# Patient Record
Sex: Female | Born: 1965 | Race: Black or African American | Hispanic: No | Marital: Single
Health system: Southern US, Community
[De-identification: ages and names within clinical notes are randomized; demographics above are authoritative.]

## PROBLEM LIST (undated history)

## (undated) DIAGNOSIS — E119 Type 2 diabetes mellitus without complications: Secondary | ICD-10-CM

---

## 2000-01-18 ENCOUNTER — Emergency Department (HOSPITAL_COMMUNITY): Admission: EM | Admit: 2000-01-18 | Discharge: 2000-01-18 | Payer: Self-pay | Admitting: Emergency Medicine

## 2005-10-06 ENCOUNTER — Emergency Department (HOSPITAL_COMMUNITY): Admission: EM | Admit: 2005-10-06 | Discharge: 2005-10-06 | Payer: Self-pay | Admitting: Emergency Medicine

## 2005-10-15 ENCOUNTER — Emergency Department (HOSPITAL_COMMUNITY): Admission: AD | Admit: 2005-10-15 | Discharge: 2005-10-15 | Payer: Self-pay | Admitting: Emergency Medicine

## 2006-11-03 ENCOUNTER — Emergency Department (HOSPITAL_COMMUNITY): Admission: EM | Admit: 2006-11-03 | Discharge: 2006-11-03 | Payer: Self-pay | Admitting: Emergency Medicine

## 2007-08-29 ENCOUNTER — Emergency Department (HOSPITAL_COMMUNITY): Admission: EM | Admit: 2007-08-29 | Discharge: 2007-08-29 | Payer: Self-pay | Admitting: Emergency Medicine

## 2007-09-30 ENCOUNTER — Inpatient Hospital Stay (HOSPITAL_COMMUNITY): Admission: EM | Admit: 2007-09-30 | Discharge: 2007-10-02 | Payer: Self-pay | Admitting: Emergency Medicine

## 2009-07-15 IMAGING — US US ABDOMEN COMPLETE
1 series · 14 of 25 positions shown · non-contrast
Comparison: None.

CLINICAL DATA: Nausea and vomiting.  Elevated bilirubin. 
 ABDOMEN ULTRASOUND:
TECHNIQUE: Complete abdominal ultrasound examination was performed including evaluation of the liver, gallbladder, bile ducts, pancreas, kidneys, spleen, IVC, and abdominal aorta.

[Series 1: unknown · 0.28mm/px · 14 of 54 slices shown]
[im 1/54]
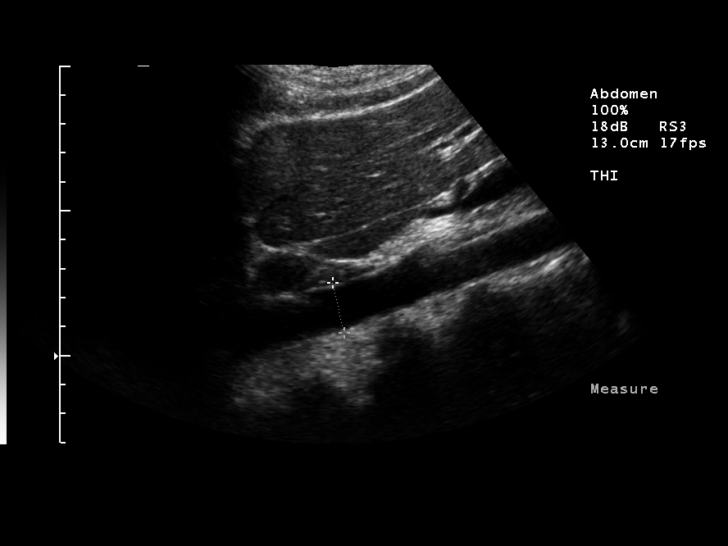
[im 5/54]
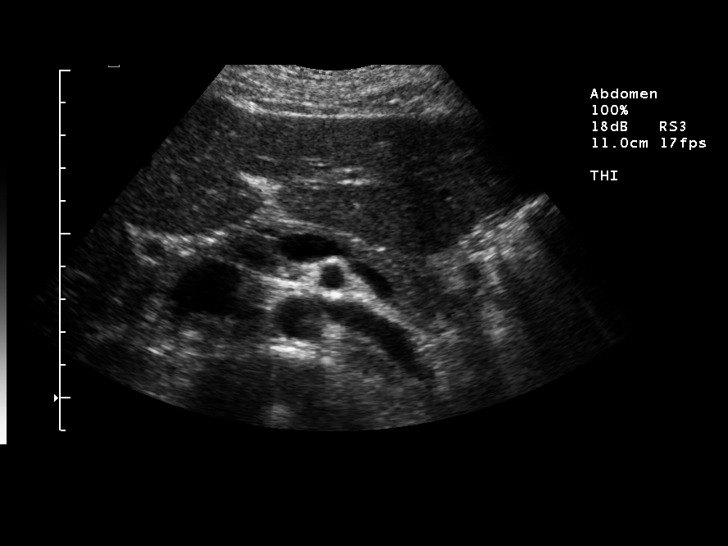
[im 9/54]
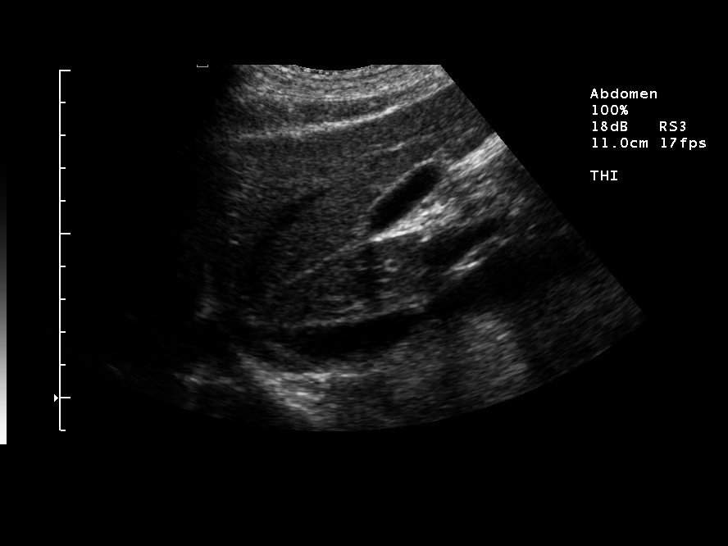
[im 14/54]
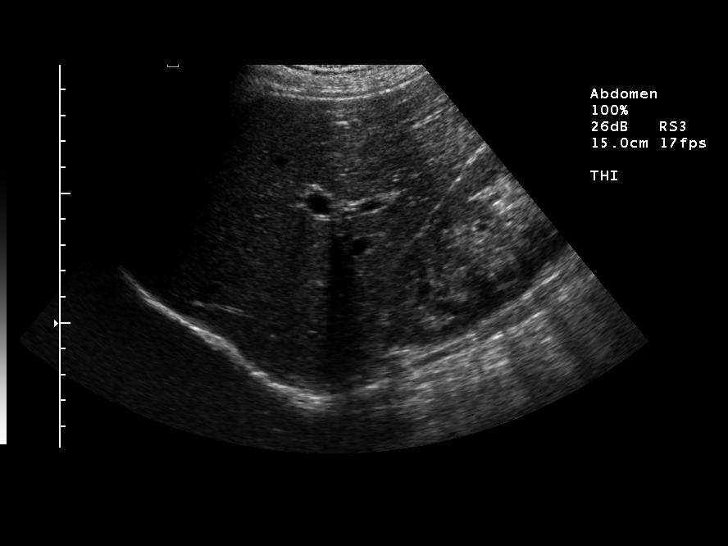
[im 18/54]
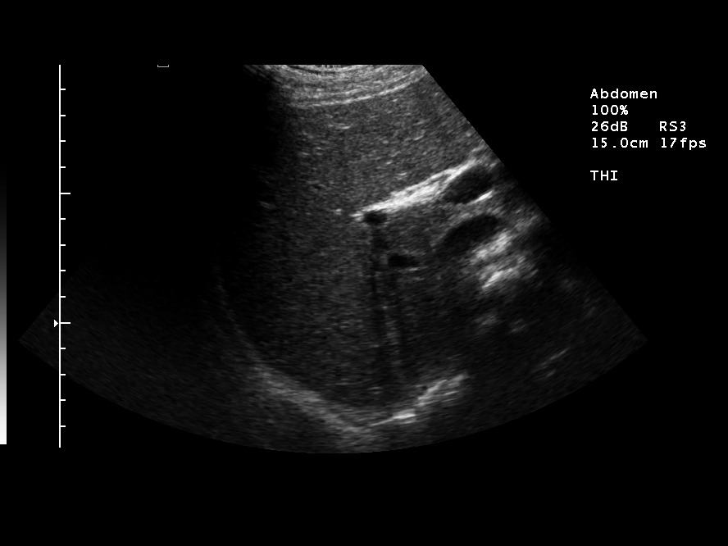
[im 20/54]
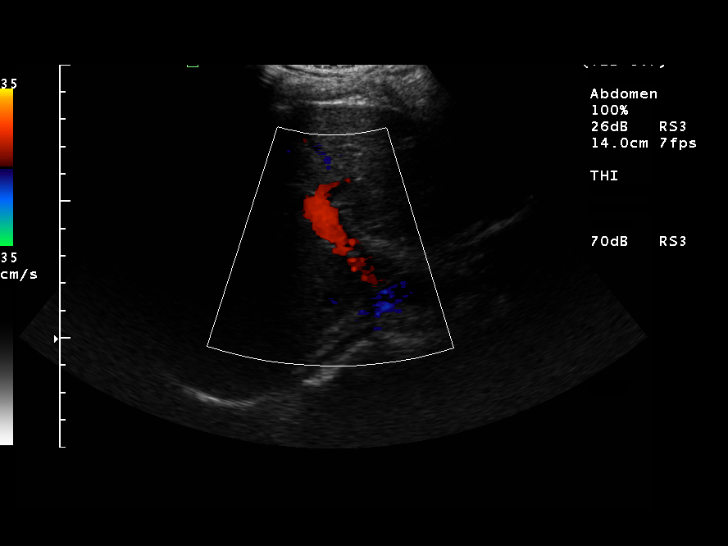
[im 25/54]
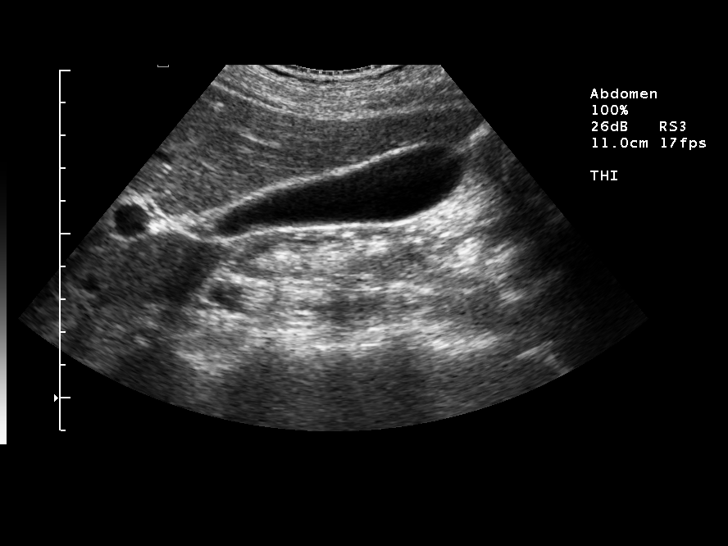
[im 29/54]
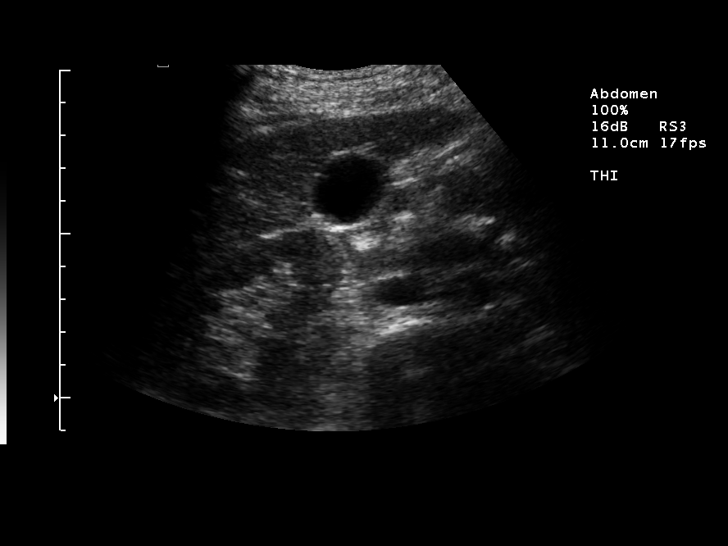
[im 34/54]
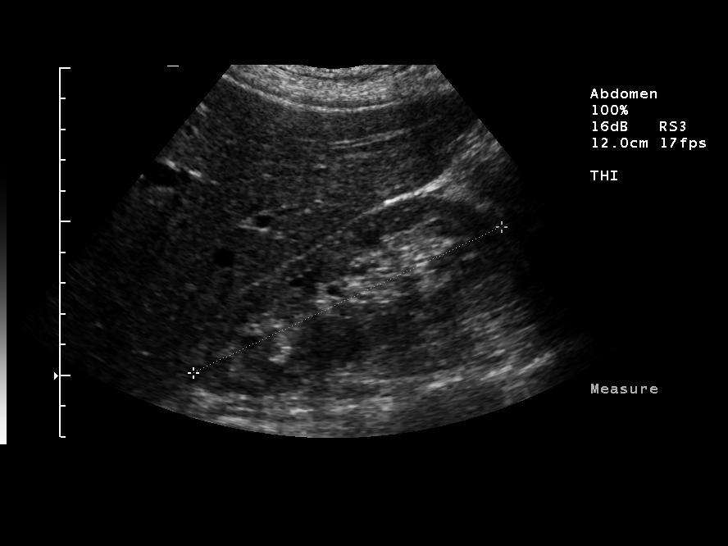
[im 36/54]
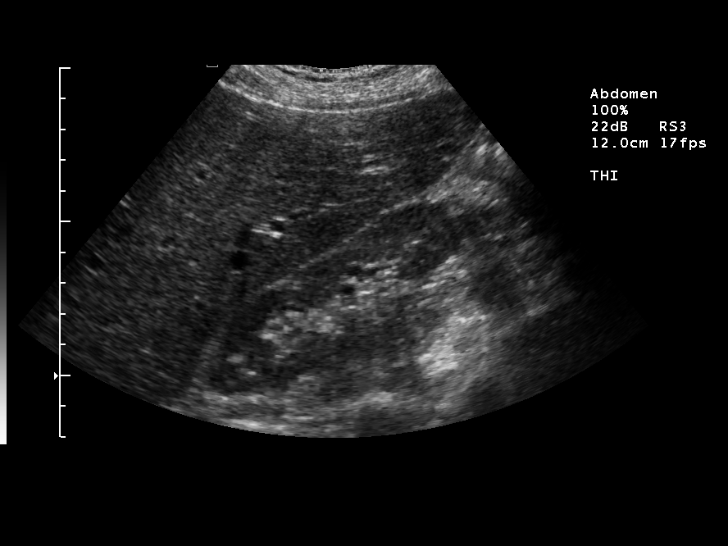
[im 40/54]
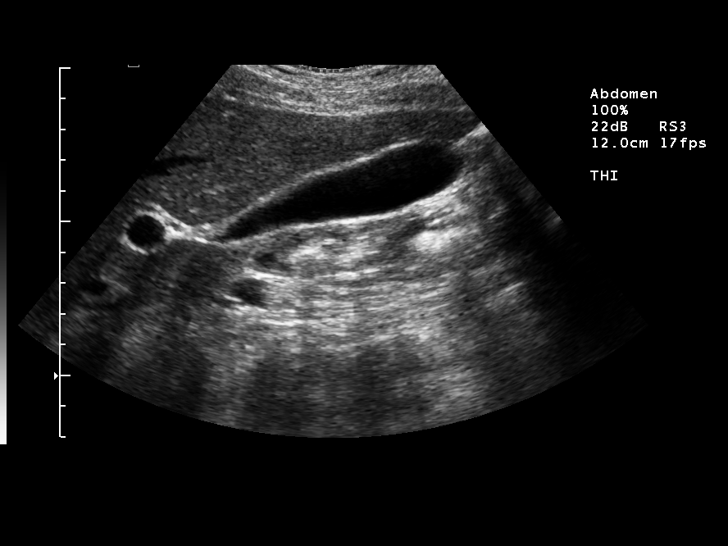
[im 45/54]
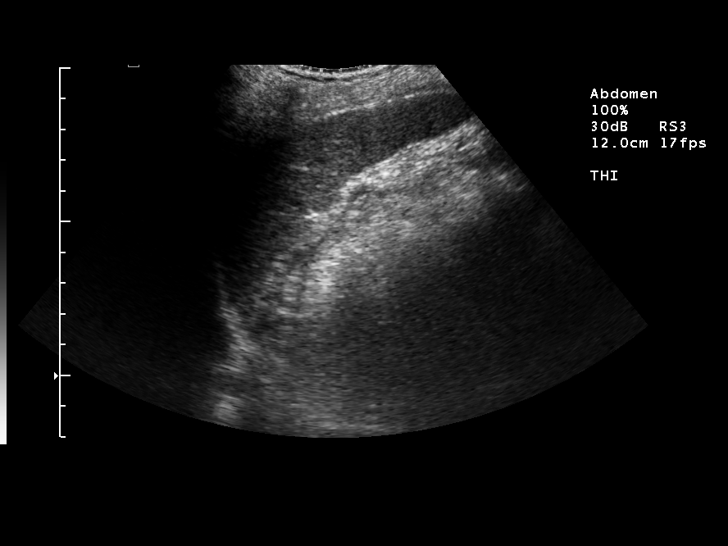
[im 49/54]
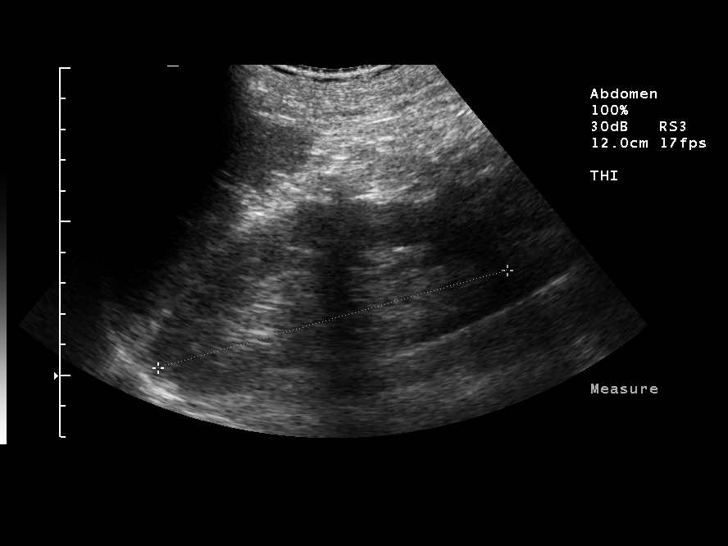
[im 54/54]
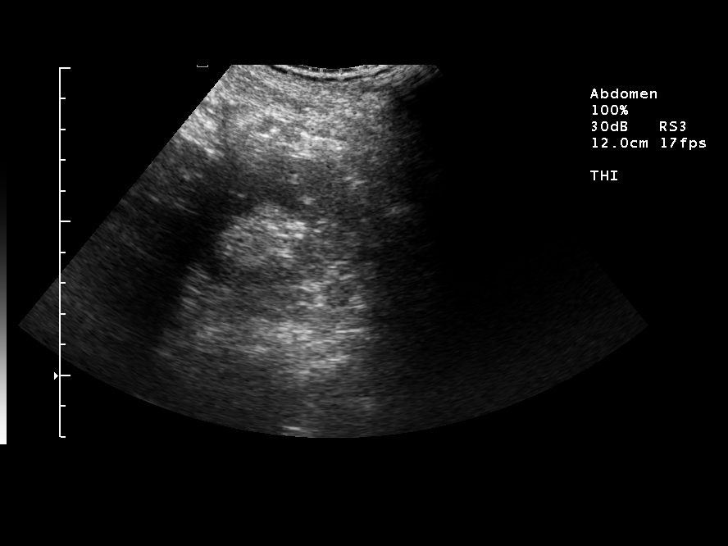

[14 of 25 positions shown; findings below may reference images not displayed]

FINDINGS: No focal hepatic lesion or intrahepatic biliary duct dilatation.  No gallstones, gallbladder wall thickening, or pericholecystic fluid.  Pancreas unremarkable.   Inferior vena cava and aorta within normal limits.  Common bile duct 4.6 mm.  
 Right kidney 11.1 cm and left kidney 11.8 cm in length without evidence of hydronephrosis or focal renal mass.  Spleen unremarkable.
IMPRESSION: Negative exam.

## 2009-07-15 IMAGING — CR DG CHEST 2V
2 series · 2 of 2 positions shown · non-contrast
Comparison: None.

CLINICAL DATA: Nausea and vomiting, diarrhea and shortness of breath.  
 CHEST - 2 VIEW:

[w chest lat]
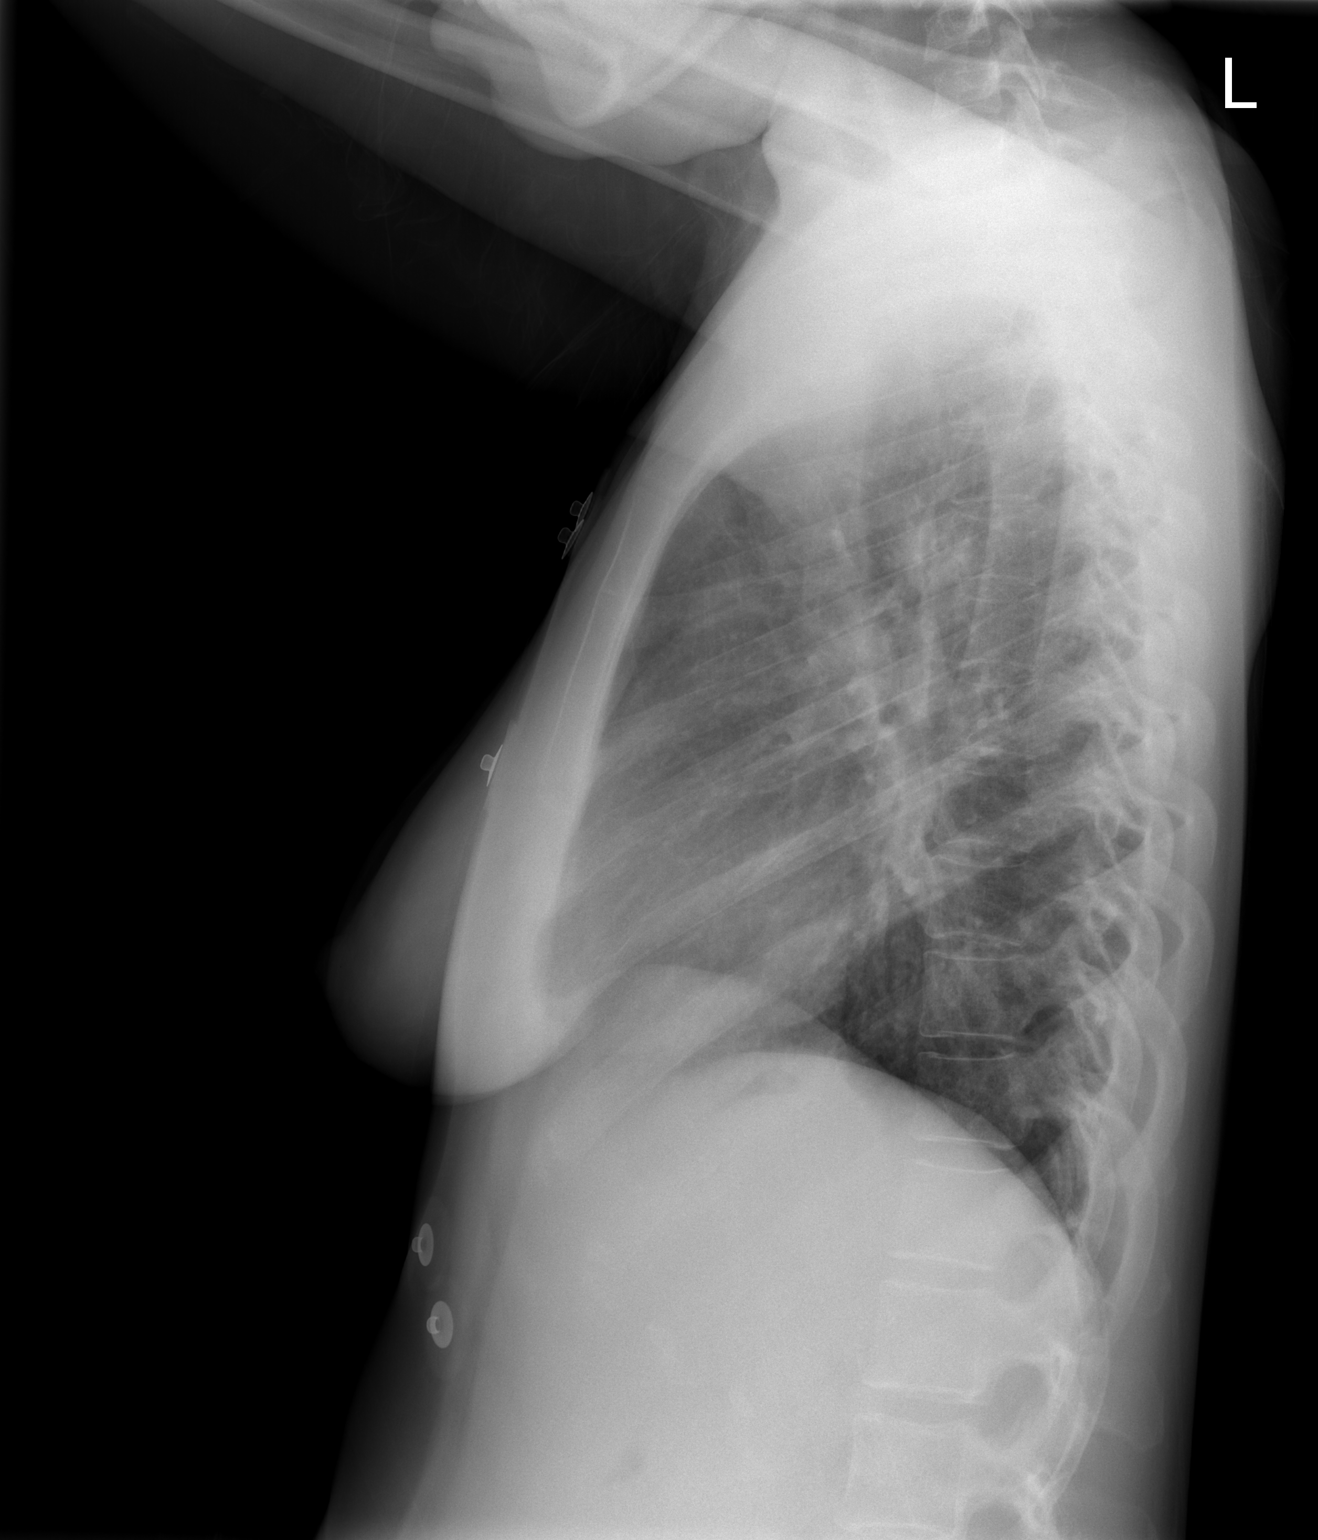

[w chest pa]
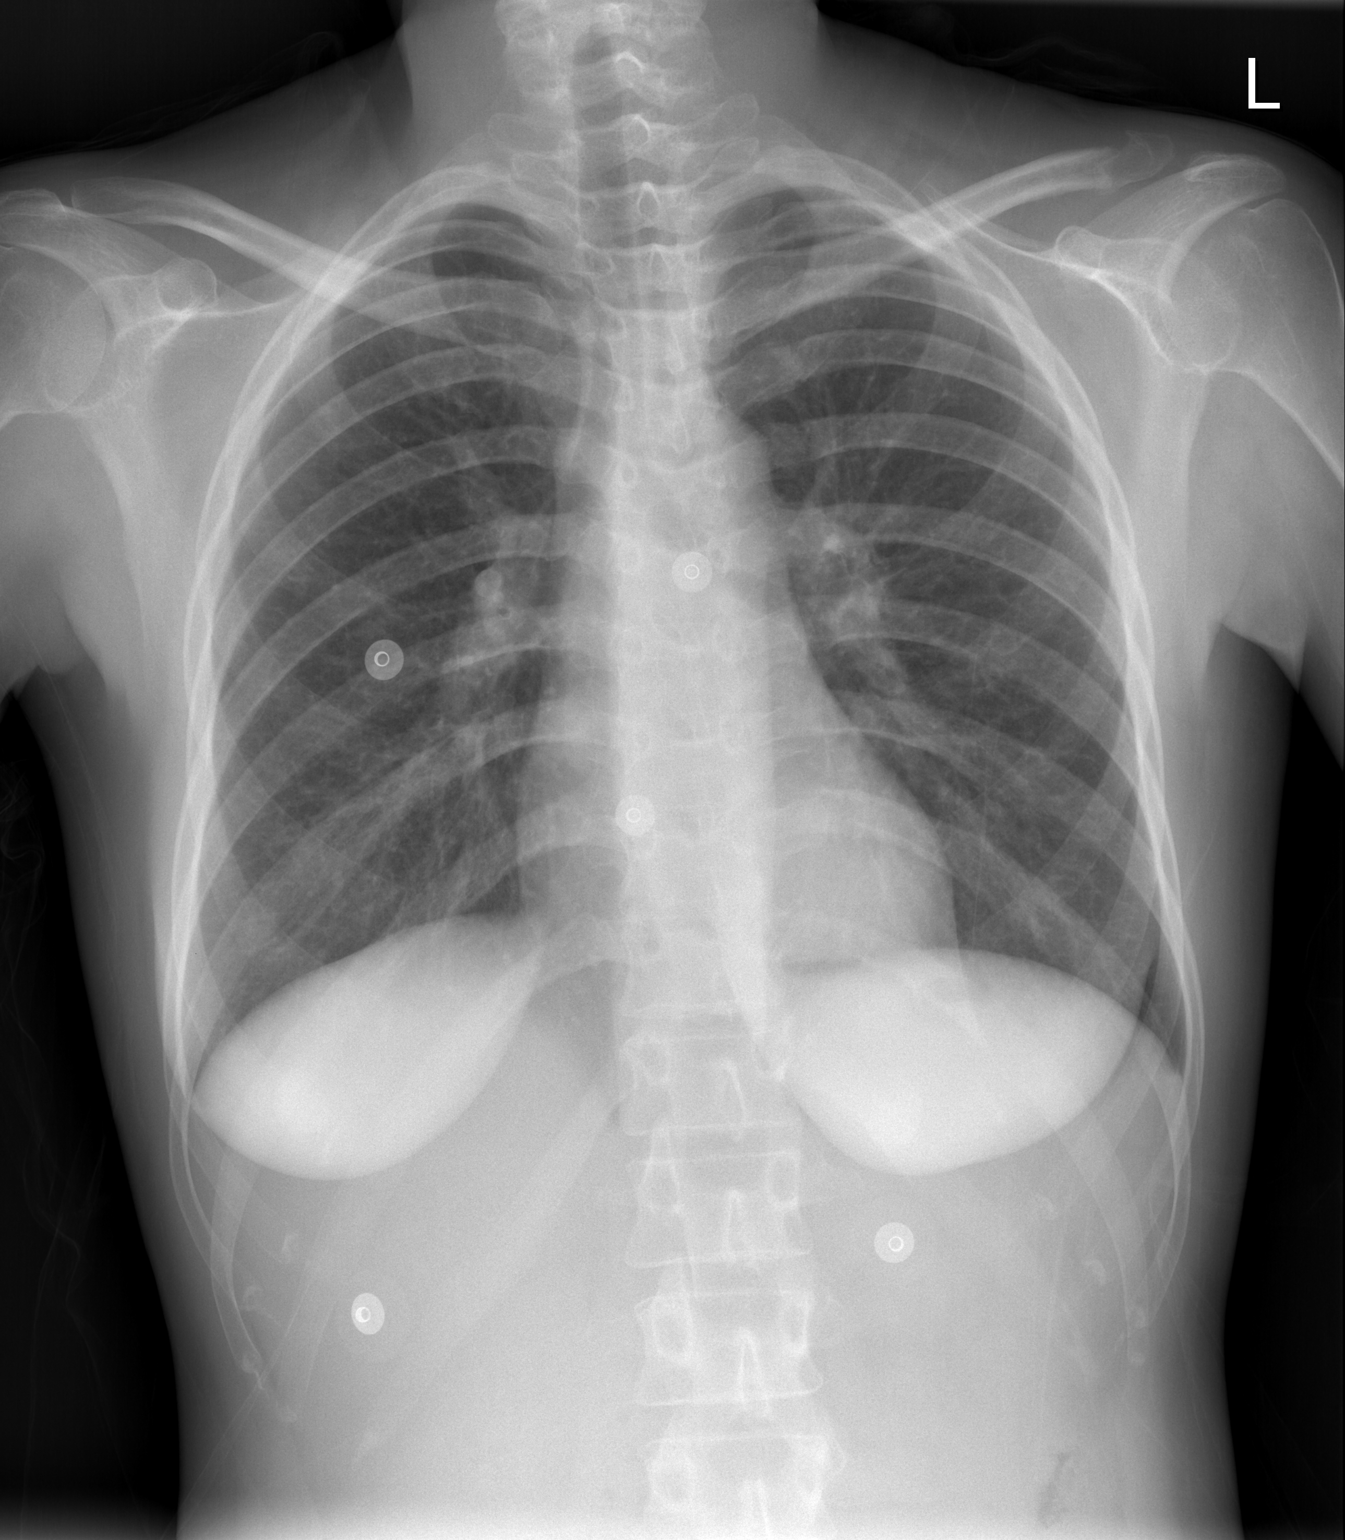

[2 of 2 positions shown; findings below may reference images not displayed]

FINDINGS: The heart size and mediastinal contours are within normal limits.  Both lungs are clear.  The visualized skeletal structures are unremarkable.
IMPRESSION: No active cardiopulmonary disease.

## 2011-02-21 NOTE — H&P (Signed)
Emily Douglas, Emily Douglas                ACCOUNT NO.:  [id number]   MEDICAL RECORD NO.:  [id number]          PATIENT TYPE:  INP   LOCATION:  0105                         FACILITY:  [facility]   PHYSICIAN:  Elliot Cousin, M.D.    DATE OF BIRTH:  14-Jan-1966   DATE OF ADMISSION:  09/30/2007  DATE OF DISCHARGE:                              HISTORY & PHYSICAL   PRIMARY CARE PHYSICIAN:  The patient is unassigned.   CHIEF COMPLAINT:  Nausea, vomiting, diarrhea, weakness, headache,  increased thirst, increased urination.  The patient has also had mild  abdominal pain.   HISTORY OF PRESENT ILLNESS:  The patient is a 45 year old woman with  past medical history significant for type 1 diabetes mellitus diagnosed  at 45 years of age who presents to the emergency department with a two  day history of nausea, vomiting, weakness, and increase in thirst and  urination.  Over the past 12-24 hours she has developed mild crampy  abdominal pain and loose stools.  She has also had generalized weakness  and a bitemporal headache.  She denies fever, chills, and a stiff neck.  She has a chronic cough which she attributes to her smoking.  She admits  that she ran out of her insulin two days ago, and was unable to acquire  more.  She has a history of DKA requiring hospitalization on at least  three or four occasions in [place].  She has been living  intermittently in [place], [place], with her boyfriend and has  not yet seen a doctor here.  She obtains insulin from [employer] which  costs at least $23 to $25 for a two week supply.  Her boyfriend usually  buys the insulin.  However, she did not let him know that she had run  out.   The patient describes her abdominal pain as mild to moderate and  sometimes crampy.  It is located over the lower part of her abdomen.  Her stools have been loose without any evidence of bright red blood per  rectum or black tarryness.  She has had at least 10 episodes of  nausea  and vomiting overnight and this morning.  She denies coffee-ground  emesis and bright red blood in her emesis.  Her headache is located  bitemporally, and it has been mild to moderate in intensity.  She has  had intermittent shortness of breath but no chest pain.  She has had  some intermittent blurred vision and a feeling of faintness but no  outright syncope.   During the evaluation in the emergency department the patient is noted  to be afebrile and tachycardic with heart rate 116 beats per minute.   LABORATORY DATA:  Significant for a venous glucose of 894, serum  potassium of 5.3, serum sodium of 128, BUN of 30, creatinine of 25, and  a bicarbonate of 9.  The patient will therefore be admitted for further  evaluation and management.   PAST MEDICAL HISTORY:  1. Type 1 diabetes mellitus diagnosed at 45 years of age.  2. History of DKA on at least three or  four occasions in the past.  3. Tobacco abuse/marijuana use.  4. Status post excision of a perirectal cyst in the past.  5. Status post C-sections x2 in the past.   MEDICATIONS:  Humulin 70/30 20 units in the morning and 5 units in the  evening.   ALLERGIES:  No known drug allergies.   SOCIAL HISTORY:  The patient is married yet separated.  She lives with  her boyfriend intermittently in [place], [place].  She is  currently staying in a motel.  She visits [place] frequently. which is  her home.  She has two children who are not in her custody at this time.  They live in [place] with relatives.  The patient is currently  unemployed.  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  She  drinks alcohol only on occasion.  She smokes marijuana on occasion.  She  smokes  approximately 1/2 pack of cigarettes per day.  She denies IV  drug use.   FAMILY HISTORY:  Her father died of complications related to diabetes  mellitus at 54 years of age.  Her mother is 51 years of age and has  hypertension.   REVIEW  OF SYSTEMS:  As above in the History of Present Illness.  In  addition the patient does have occasional numbness and tingling in her  extremities, blurred vision, and occasional headaches.  She denies  painful urination, fever, chills, stiff neck, chest pain, pleurisy,  melena, bright red blood per rectum, swelling in her legs or nonhealing  ulcers.   PHYSICAL EXAMINATION:  VITAL SIGNS:  Temperature 97.7, blood pressure  104/51, pulse 116, respiratory rate 18, oxygen saturation 99% on room  air.  GENERAL:  The patient is a pleasant 45 year old African-American woman  who is currently sitting up in bed in no acute distress, although she  does appear ill.  HEENT:  Head normocephalic, nontraumatic.  Pupils are equal, round, and  reactive to light.  Extraocular movements are intact.  Conjunctivae are  clear, sclerae are white.  Tympanic membranes are partially obscured by  cerumen.  However, it appears that they are without erythema.  Oropharynx reveals fair dentition.  Mucous membranes are very dry.  No  posterior exudates or erythema.  NECK:  Supple, no adenopathy, no thyromegaly, no JVD.  LUNGS:  Clear to auscultation bilaterally.  HEART:  S1, S2 with mild tachycardia.  ABDOMEN:  Hypoactive bowel sounds, soft, mildly tender in the epigastric  area and the hypogastrium.  No rebound, no guarding, no distention, no  hepatosplenomegaly.  GU/RECTAL:  Deferred.  EXTREMITIES:  Pedal pulses palpable bilaterally.  No pretibial edema, no  pedal edema.  NEUROLOGIC:  The patient is alert and oriented x3.  Cranial nerves II-  XII are intact.  Strength is 5/5 throughout.  Sensation is intact.   LABORATORY DATA:  Admission laboratories:  Chest x-ray reveals no active  acute cardiopulmonary disease.   Urinalysis greater than 1000 urine glucose, ketones greater than 80,  blood negative, nitrite negative, leukocytes negative.  Urine HCG  negative.  Sodium 128, potassium 5.3, CO2 9, glucose 894, BUN  30,  creatinine 2.2.  Total bilirubin 3, alkaline phosphatase 89.  SGOT 32,  SGPT 31.  Total protein 6.7.  Albumin 3.6.  Calcium 9.  Lipase 14.  WBC  9, hemoglobin 11.6, platelets 402.   ASSESSMENT:  1. Diabetic ketoacidosis.  The patient's venous glucose is 894, and      her bicarbonate is 9.  2. Noncompliance.  3. Hyponatremia.  The patient's serum sodium is 128.  Most likely the      hyponatremia is secondary to her elevated venous glucose.  4. Hyperkalemia.  The patient's serum potassium is 5.3.  This is      secondary to profound uncontrolled diabetes mellitus.  5. Acute renal failure.  The patient's BUN is 30, and her creatinine      is 2.2.  The patient denies any known history of chronic kidney      disease related to her diabetes mellitus.  However, she has not      seen a physician in approximately three years.  6. Mild abdominal pain, nausea, vomiting, and loose stools.  More than      likely these symptoms are secondary to diabetic ketoacidosis.      Given the hyperbilirubinemia, gallbladder disease will need to be      ruled out.  7. Tobacco abuse/marijuana use.   PLAN:  1. The patient will be admitted to the ICU.  2. Aggressive IV fluid volume repletion.  Will start the insulin drip      protocol.  3. Supportive care, antiemetics, prophylactic Protonix and Lovenox and      as needed morphine for pain.  4. Will allow ice chips and sips of noncaloric beverages as tolerated.  5. Will follow the patient's renal function, bicarbonate, and serum      potassium closely.  Will change IV fluids accordingly.  6. Case manager consult for assistance with medications and referral      to the [facility] clinic.  7. The patient was advised to stop smoking.  Will order official      tobacco and drug cessation counseling.  8. Will check an ultrasound of the abdomen to rule out acute      gallbladder disease.      Elliot Cousin, M.D.  Electronically Signed     DF/MEDQ  D:   09/30/2007  T:  09/30/2007  Job:  [id number]

## 2011-02-21 NOTE — Discharge Summary (Signed)
[name], Douglas                ACCOUNT NO.:  [id number]   MEDICAL RECORD NO.:  [id number]          PATIENT TYPE:  INP   LOCATION:  1401                         FACILITY:  [facility]   PHYSICIAN:  Emily Douglas, M.D.DATE OF BIRTH:  1966/01/04   DATE OF ADMISSION:  09/30/2007  DATE OF DISCHARGE:  09/12/2007                               DISCHARGE SUMMARY   PRIMARY CARE PHYSICIAN:  Unassigned.   FINAL DIAGNOSES:  1. Diabetic ketoacidosis secondary to noncompliance with medication.  2. Type 1 diabetes mellitus.  3. Pseudohyponatremia.  4. Hypokalemia.  5. Acute renal failure secondary to dehydration.  6. Hyperbilirubinemia, resolved.  7. Active bronchitis.  8. Tobacco abuse.   PROCEDURES:  1. Ultrasound of the abdomen showed no acute abnormality.  2. Chest x-ray showed no acute pulmonary disease.   BRIEF HISTORY:  Emily Douglas is a pleasant, 45 year old African-American  female who recently relocated from [place].  She joined her boyfriend  here in [place].  She ran out of her medications for 2-3 days and  could not obtain them.  She came to the emergency room with nausea,  vomiting, weakness, abdominal pain, headaches, and increased urination.  She was admitted with a bicarb of 9 and a wide anion gap.   HISTORY AND PHYSICAL:  Problem #1:  Diabetic ketoacidosis.  The patient  was started on insulin infusion, IV fluids, and electrolytes were  correction.  Anion gap closed within 24 hours and she was transitioned  to Lantus insulin.  Hemoglobin A1c was 11.8 indicating poor control.  Given the patient's lack of finances to purchase Lantus it was felt that  Humulin 70/30 which was using prior to hospitalization will be more  appropriate for her from the standpoint of finances.  Hence, insulin was  changed from Lantus to Novolin 70/30.  Her blood glucose has been  controlled in the range of 150-160 prior to discharge.  Humulin will be  increased to her usual home dose, and  further titration will be done per  her primary care physician. She has been set up to follow up at  [facility], where she can obtain her medications. DKA and uncontrolled  diabetes was due to non compliance.   She has been asked to followup at [facility] and also to have  patient  diabetic education.  A prescription was given to the patient for glucose  monitoring kit.  She has been consulted regarding diabetic diet.   Nausea, vomiting, and weakness have resolved.  These are felt to be  secondary to the diabetic ketoacidosis.   Problem #2:  Acute renal failure.  The patient had increased focus of  micturition and was noted, on admission to have a BUN of 13, and a  creatinine of 2.2.  At this time it is unknown which aggressive fluid  hydration BUN and creatinine normalized.  At the time of discharge BUN  was 5 and creatinine 0.96.  The patient was also admitted with a  potassium of 5.3, mildly elevated.  This corrected with IV fluid.   Problem #3:  Acute bronchitis.  She has a  history of tobacco abuse.  The  patient stated that she usually coughs, but this is more than what her  usual cough is.  She expressed some sore throat, but there was no  redness or erythema on the oropharyngeal examination.  She says that  this is related to her heartburn.  She was given Mucinex and Zithromax  500 mg daily for 3 days.  In addition, she would continue PPI using over-the-counter Prilosec for  2 weeks.  It should be noted that the chest x-ray was normal.   DISCHARGE MEDICATIONS:  1. NovoLog 70/30 taking 20 units in the a.m. and 5 units in p.m.  2. Zithromax 500 mg daily, treat October 03, 2007.  3. Mucinex 600 mg b.i.d. for 1 week.  4. Nicotine patch 14 mg daily.  5. Prilosec OTC 1 daily for 2 weeks.  6. Iron supplement 1 daily.   Normocytic anemia.  The patient was admitted with a hemoglobin of 11.9.  With hydration the hemoglobin came down to 9.9.  This is probably  hemodilution.  We  went ahead and obtained a stool Hemoccult which was  negative.  Anemia panel is pending at the time of discharge.  She will  follow up at [facility] with these results.   DISCHARGE CONDITION:  Stable.   FOLLOWUP:  1. At [facility] in 1 week.  2. Followup with outpatient diabetic education.   DISCHARGE LABORATORY DATA:  Sodium 137, potassium 4.6, chloride 111,  bicarb 22, BUN 5, creatinine 0.96, glucose 159, calcium 7.8.  White  cells 7.8, hemoglobin 9.9, hematocrit 28.7, platelets 293.      Emily Douglas, M.D.  Electronically Signed     MBB/MEDQ  D:  10/02/2007  T:  10/02/2007  Job:  [id number]

## 2011-07-14 LAB — HEPATIC FUNCTION PANEL
Albumin: 2.6 — ABNORMAL LOW
Indirect Bilirubin: 0.8
Total Protein: 5.2 — ABNORMAL LOW

## 2011-07-14 LAB — URINALYSIS, ROUTINE W REFLEX MICROSCOPIC
Bilirubin Urine: NEGATIVE
Glucose, UA: >1000 — AB
Hgb urine dipstick: NEGATIVE
Ketones, ur: >80 — AB
Leukocytes, UA: NEGATIVE
Nitrite: NEGATIVE
Protein, ur: NEGATIVE
Specific Gravity, Urine: 1.022
Urobilinogen, UA: 0.2
pH: 5

## 2011-07-14 LAB — COMPREHENSIVE METABOLIC PANEL
ALT: 31
AST: 32
Albumin: 3.6
Alkaline Phosphatase: 89
BUN: 30 — ABNORMAL HIGH
CO2: 9 — CL
Calcium: 9
Chloride: 88 — ABNORMAL LOW
Creatinine, Ser: 2.2 — ABNORMAL HIGH
GFR calc Af Amer: 30 — ABNORMAL LOW
GFR calc non Af Amer: 25 — ABNORMAL LOW
Glucose, Bld: 894
Potassium: 5.3 — ABNORMAL HIGH
Sodium: 128 — ABNORMAL LOW
Total Bilirubin: 3 — ABNORMAL HIGH
Total Protein: 6.7

## 2011-07-14 LAB — BASIC METABOLIC PANEL
BUN: 24 — ABNORMAL HIGH
CO2: 22
CO2: 23
Calcium: 7.5 — ABNORMAL LOW
Calcium: 7.8 — ABNORMAL LOW
Calcium: 8.3 — ABNORMAL LOW
Chloride: 101
Chloride: 105
Creatinine, Ser: 0.96
Creatinine, Ser: 1.24 — ABNORMAL HIGH
GFR calc Af Amer: 44 — ABNORMAL LOW
GFR calc Af Amer: 56 — ABNORMAL LOW
GFR calc Af Amer: 58 — ABNORMAL LOW
GFR calc Af Amer: >60
GFR calc non Af Amer: 36 — ABNORMAL LOW
GFR calc non Af Amer: 48 — ABNORMAL LOW
GFR calc non Af Amer: >60
Glucose, Bld: 136 — ABNORMAL HIGH
Glucose, Bld: 171 — ABNORMAL HIGH
Potassium: 3.7
Potassium: 4.4
Potassium: 4.6
Potassium: 4.6
Sodium: 137

## 2011-07-14 LAB — RETICULOCYTES
RBC.: 3.41 — ABNORMAL LOW
Retic Count, Absolute: 58

## 2011-07-14 LAB — TSH: TSH: 1.004

## 2011-07-14 LAB — GLYCOHEMOGLOBIN, TOTAL: Hemoglobin-A1c: 6

## 2011-07-14 LAB — URINE MICROSCOPIC-ADD ON

## 2011-07-14 LAB — CBC
HCT: 28.7 — ABNORMAL LOW
HCT: 34.6 — ABNORMAL LOW
Hemoglobin: 11.6 — ABNORMAL LOW
Hemoglobin: 9.9 — ABNORMAL LOW
MCHC: 33.5
MCHC: 34.6
MCV: 93.8
Platelets: 402 — ABNORMAL HIGH
RBC: 3.69 — ABNORMAL LOW
RDW: 13.7
RDW: 14.1
WBC: 7.8
WBC: 9

## 2011-07-14 LAB — DIFFERENTIAL
Basophils Absolute: 0
Lymphocytes Relative: 12
Lymphs Abs: 1.1
Neutro Abs: 7.6

## 2011-07-14 LAB — FERRITIN: Ferritin: 15 (ref 10–291)

## 2011-07-14 LAB — IRON AND TIBC: TIBC: 315

## 2011-07-14 LAB — PREGNANCY, URINE: Preg Test, Ur: NEGATIVE

## 2011-07-14 LAB — VITAMIN B12: Vitamin B-12: 1241 — ABNORMAL HIGH (ref 211–911)

## 2019-12-30 DIAGNOSIS — R569 Unspecified convulsions: Secondary | ICD-10-CM

## 2019-12-30 HISTORY — DX: Unspecified convulsions: R56.9

## 2020-01-05 ENCOUNTER — Encounter (HOSPITAL_COMMUNITY): Payer: Self-pay

## 2020-01-05 ENCOUNTER — Ambulatory Visit (HOSPITAL_COMMUNITY)
Admission: EM | Admit: 2020-01-05 | Discharge: 2020-01-05 | Disposition: A | Discharge status: Home / Self Care | Payer: Medicaid Other | Attending: Family Medicine | Admitting: Family Medicine

## 2020-01-05 ENCOUNTER — Other Ambulatory Visit: Payer: Self-pay

## 2020-01-05 DIAGNOSIS — H00025 Hordeolum internum left lower eyelid: Secondary | ICD-10-CM

## 2020-01-05 DIAGNOSIS — R519 Headache, unspecified: Secondary | ICD-10-CM

## 2020-01-05 DIAGNOSIS — H1032 Unspecified acute conjunctivitis, left eye: Secondary | ICD-10-CM

## 2020-01-05 HISTORY — DX: Type 2 diabetes mellitus without complications: E11.9

## 2020-01-05 MED ORDER — SULFAMETHOXAZOLE-TRIMETHOPRIM 800-160 MG PO TABS: 1.0000 | ORAL_TABLET | Freq: Two times a day (BID) | ORAL | 0 refills | Status: AC

## 2020-01-05 NOTE — Discharge Instructions (Addendum)
Take the antibiotics as prescribed. If there are no improvements in the next 1-2 days, follow up with an eye dr or this office.   Go to the ER for sudden vision loss, other concerning symptoms.

## 2020-01-05 NOTE — ED Provider Notes (Signed)
[facility]    CSN: [id number] Arrival date & time: 01/05/20  1132      History   Chief Complaint Chief Complaint  Patient presents with  . Eye Problem    HPI Emily Douglas is a 54 y.o. female.   Patient reports that she has been having left eye pain for the last 3 days.  Patient reports that she had a seizure at a friend's house, and that she fell and hit her face on a table and then ended up in the floor.  Reports that the seizure was related to her blood sugar.  Per chart review, there is no seizure history.  But according to patient, she has seizures every now and and when her blood sugar gets out of control.  Reports that she has been using warm compresses, and Neosporin to her eyelid.  Patient is concerned about a possible scratch or foreign body in the eye.  Reports that it feels like there is something in her eye, and that she just wants to be sure that she is okay.  Denies headaches, changes in vision, vision loss, sore throat, shortness of breath, chest tightness,  The history is provided by the patient.    Past Medical History:  Diagnosis Date  . Diabetes mellitus without complication (HCC)   . Seizure (HCC) 12/30/2019    There are no problems to display for this patient.   Past Surgical History:  Procedure Laterality Date  . CESAREAN SECTION      OB History   No obstetric history on file.      Home Medications    Prior to Admission medications   Medication Sig Start Date End Date Taking? Authorizing Provider  sulfamethoxazole-trimethoprim (BACTRIM DS) 800-160 MG tablet Take 1 tablet by mouth 2 (two) times daily for 7 days. 01/05/20 01/12/20  Moshe Cipro, NP    Family History History reviewed. No pertinent family history.  Social History Social History   Tobacco Use  . Smoking status: Not on file  Substance Use Topics  . Alcohol use: Not on file  . Drug use: Not on file     Allergies   Mobic [meloxicam], Motrin [ibuprofen],  and Tramadol   Review of Systems Review of Systems   Physical Exam Triage Vital Signs ED Triage Vitals  Enc Vitals Group     BP 01/05/20 1211 (!) 151/75     Pulse Rate 01/05/20 1211 80     Resp 01/05/20 1211 16     Temp --      Temp src --      SpO2 01/05/20 1211 100 %     Weight --      Height --      Head Circumference --      Peak Flow --      Pain Score 01/05/20 1208 6     Pain Loc --      Pain Edu? --      Excl. in GC? --    No data found.  Updated Vital Signs BP (!) 151/75 (BP Location: Right Arm)   Pulse 80   Resp 16   SpO2 100%   Visual Acuity Right Eye Distance:   Left Eye Distance:   Bilateral Distance:    Right Eye Near:   Left Eye Near:    Bilateral Near:     Physical Exam   UC Treatments / Results  Labs (all labs ordered are listed, but only abnormal results are displayed) Labs  Reviewed - No data to display  EKG   Radiology No results found.  Procedures Procedures (including critical care time)  Medications Ordered in UC Medications - No data to display  Initial Impression / Assessment and Plan / UC Course  I have reviewed the triage vital signs and the nursing notes.  Pertinent labs & imaging results that were available during my care of the patient were reviewed by me and considered in my medical decision making (see chart for details).     Presents with left eye irritation and pain for the last 3 days.  Patient thinks this is related to her fall.  Hordeolum present on left lower internum of the eyelid.  Appears to also be infected, will treat with Bactrim empirically twice daily x7 days, as she has swelling and tenderness down to her left cheek.  Instructed that she may continue to use a stye ointment, as well as moisturizing drops.  Instructed to follow-up with this office or with primary care if symptoms are not improving over the next 2 days.  Also instructed that she may follow-up with ophthalmology.  Instructed to go to the ER  for concerning symptoms such as sudden loss of vision, thunderclap headache, unrelenting pain. Final Clinical Impressions(s) / UC Diagnoses   Final diagnoses:  Hordeolum internum of left lower eyelid  Nonintractable headache, unspecified chronicity pattern, unspecified headache type  Acute bacterial conjunctivitis of left eye     Discharge Instructions     Take the antibiotics as prescribed. If there are no improvements in the next 1-2 days, follow up with an eye dr or this office.   Go to the ER for sudden vision loss, other concerning symptoms.     ED Prescriptions    Medication Sig Dispense Auth. Provider   sulfamethoxazole-trimethoprim (BACTRIM DS) 800-160 MG tablet Take 1 tablet by mouth 2 (two) times daily for 7 days. 14 tablet Faustino Congress, NP     I have reviewed the PDMP during this encounter.   Faustino Congress, NP 01/07/20 [phone]

## 2020-01-05 NOTE — ED Triage Notes (Signed)
Patient reports she had a seizure last week in someone's floor. States she thinks she may have scratched her eye and gotten dirt in it. Eye swelling and drainage in bilateral eyes.

## 2020-01-07 ENCOUNTER — Encounter (HOSPITAL_COMMUNITY): Payer: Self-pay | Admitting: Family Medicine

## 2022-06-09 DEATH — deceased
